# Patient Record
Sex: Male | Born: 2000 | Race: White | Hispanic: No | Marital: Single | Smoking: Never smoker
Health system: Southern US, Community
[De-identification: ages and names within clinical notes are randomized; demographics above are authoritative.]

## PROBLEM LIST (undated history)

## (undated) DIAGNOSIS — F4010 Social phobia, unspecified: Secondary | ICD-10-CM

## (undated) DIAGNOSIS — R45851 Suicidal ideations: Secondary | ICD-10-CM

## (undated) DIAGNOSIS — F333 Major depressive disorder, recurrent, severe with psychotic symptoms: Secondary | ICD-10-CM

## (undated) DIAGNOSIS — J302 Other seasonal allergic rhinitis: Secondary | ICD-10-CM

## (undated) DIAGNOSIS — F332 Major depressive disorder, recurrent severe without psychotic features: Secondary | ICD-10-CM

## (undated) DIAGNOSIS — Z789 Other specified health status: Secondary | ICD-10-CM

## (undated) DIAGNOSIS — K219 Gastro-esophageal reflux disease without esophagitis: Secondary | ICD-10-CM

## (undated) DIAGNOSIS — J45909 Unspecified asthma, uncomplicated: Secondary | ICD-10-CM

## (undated) HISTORY — PX: KNEE SURGERY: SHX244

---
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
## 2021-03-29 ENCOUNTER — Emergency Department
Admission: EM | Admit: 2021-03-29 | Discharge: 2021-03-29 | Disposition: A | Discharge status: Home / Self Care | Payer: No Typology Code available for payment source | Attending: Emergency Medicine | Admitting: Emergency Medicine

## 2021-03-29 ENCOUNTER — Other Ambulatory Visit: Payer: Self-pay

## 2021-03-29 ENCOUNTER — Encounter: Payer: Self-pay | Admitting: Intensive Care

## 2021-03-29 DIAGNOSIS — Y990 Civilian activity done for income or pay: Secondary | ICD-10-CM | POA: Diagnosis not present

## 2021-03-29 DIAGNOSIS — L089 Local infection of the skin and subcutaneous tissue, unspecified: Secondary | ICD-10-CM

## 2021-03-29 DIAGNOSIS — W268XXA Contact with other sharp object(s), not elsewhere classified, initial encounter: Secondary | ICD-10-CM | POA: Diagnosis not present

## 2021-03-29 DIAGNOSIS — S61217A Laceration without foreign body of left little finger without damage to nail, initial encounter: Secondary | ICD-10-CM | POA: Diagnosis not present

## 2021-03-29 DIAGNOSIS — Z79899 Other long term (current) drug therapy: Secondary | ICD-10-CM | POA: Diagnosis not present

## 2021-03-29 DIAGNOSIS — J45909 Unspecified asthma, uncomplicated: Secondary | ICD-10-CM | POA: Insufficient documentation

## 2021-03-29 DIAGNOSIS — S6992XA Unspecified injury of left wrist, hand and finger(s), initial encounter: Secondary | ICD-10-CM | POA: Diagnosis present

## 2021-03-29 MED ORDER — SULFAMETHOXAZOLE-TRIMETHOPRIM 800-160 MG PO TABS
1.0000 | ORAL_TABLET | Freq: Once | ORAL | Status: AC
  Administered 2021-03-29: 1 via ORAL
  Filled 2021-03-29: qty 1

## 2021-03-29 MED ORDER — SULFAMETHOXAZOLE-TRIMETHOPRIM 800-160 MG PO TABS: 1.0000 | ORAL_TABLET | Freq: Two times a day (BID) | ORAL | 0 refills | Status: AC

## 2021-03-29 NOTE — ED Triage Notes (Signed)
Patient has lac present to right hand, pinky. Bleeding controlled. Cut by glass 2 nights ago

## 2021-03-29 NOTE — ED Notes (Signed)
Patient given discharge instructions, all questions answered. Patient in possession of all belongings, directed to the discharge area  

## 2021-03-29 NOTE — ED Provider Notes (Signed)
[facility] Emergency Department Provider Note  ____________________________________________  Time seen: Approximately 8:17 PM  I have reviewed the triage vital signs and the nursing notes.   HISTORY  Chief Complaint Extremity Laceration    HPI Scott Lynch is a 20 y.o. male who presents emergency department with a laceration to the pinky finger of the left hand.  Patient sustained a laceration workplace glass 2 days ago.  Patient states that every time he moves at the edges open up, he has started to have some increased pain, erythema and a yellowish discharge.  Patient denies any edema of the digit itself.  Full range of motion is preserved.  Up-to-date on tetanus shot.       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 Authorizing Provider  sulfamethoxazole-trimethoprim (BACTRIM DS) 800-160 MG tablet Take 1 tablet by mouth 2 (two) times daily. 03/29/21  Yes Anyeli Hockenbury, Delorise Royals, PA-C  hydrOXYzine (ATARAX/VISTARIL) 25 MG tablet Take 0.5-1 tablets (12.5-25 mg total) by mouth 3 (three) times daily as needed for anxiety.  05/07/20   Darcel Smalling, MD  loratadine (CLARITIN) 10 MG tablet Take 10 mg by mouth daily.    [provider]  omeprazole (PRILOSEC OTC) 20 MG tablet Take by mouth. 07/25/19 07/24/20  [provider]  venlafaxine XR (EFFEXOR-XR) 37.5 MG 24 hr capsule Take 1 capsule (37.5 mg total) by mouth daily with breakfast. 04/09/20   Darcel Smalling, MD    Allergies Patient has no known allergies.  History reviewed. No pertinent family history.  Social History Social History   Tobacco Use   Smoking status: Never   Smokeless tobacco: Never  Vaping Use   Vaping Use: Never used  Substance Use Topics   Alcohol use: Yes   Drug use: No     Review of Systems  Constitutional: No fever/chills Eyes: No visual changes. No discharge ENT: No upper respiratory complaints. Cardiovascular: no chest pain. Respiratory: no cough. No SOB. Gastrointestinal: No abdominal pain.  No nausea, no vomiting.  No diarrhea.  No constipation. Musculoskeletal: Laceration to the pinky finger of the left hand Skin: Negative for rash, abrasions, lacerations, ecchymosis. Neurological: Negative for headaches, focal weakness or numbness.  10 System ROS otherwise negative.  ____________________________________________   PHYSICAL EXAM:  VITAL SIGNS: ED Triage Vitals [03/29/21 1736]  Enc Vitals Group     BP (!) 150/98     Pulse Rate (!) 58     Resp 16     Temp 99 F (37.2 C)     Temp Source Oral     SpO2 100 %     Weight 220 lb (99.8 kg)  Height 6\' 3"  (1.905 m)     Head Circumference      Peak Flow      Pain Score 3     Pain Loc      Pain Edu?      Excl. in GC?      Constitutional: Alert and oriented. Well appearing and in no acute distress. Eyes: Conjunctivae are normal. PERRL. EOMI. Head: Atraumatic. ENT:      Ears:       Nose: No congestion/rhinnorhea.      Mouth/Throat: Mucous membranes are moist.  Neck: No stridor.    Cardiovascular: Normal rate, regular rhythm. Normal S1  and S2.  Good peripheral circulation. Respiratory: Normal respiratory effort without tachypnea or retractions. Lungs CTAB. Good air entry to the bases with no decreased or absent breath sounds. Musculoskeletal: Full range of motion to all extremities. No gross deformities appreciated.  Laceration is visualized to the palmar aspect of the fifth digit left hand.  This is on the pinky finger.  Roughly a centimeter in length.  Edges do appear to have cellular death consistent with a 38-day-old laceration.  There is some dried crusting discharge and mild surrounding erythema concerning for early onset infection.  There is no sausage digit.  Range of motion is fully preserved. Neurologic:  Normal speech and language. No gross focal neurologic deficits are appreciated.  Skin:  Skin is warm, dry and intact. No rash noted. Psychiatric: Mood and affect are normal. Speech and behavior are normal. Patient exhibits appropriate insight and judgement.   ____________________________________________   LABS (all labs ordered are listed, but only abnormal results are displayed)  Labs Reviewed - No data to display ____________________________________________  EKG   ____________________________________________  RADIOLOGY   No results found.  ____________________________________________    PROCEDURES  Procedure(s) performed:    Procedures    Medications  sulfamethoxazole-trimethoprim (BACTRIM DS) 800-160 MG per tablet 1 tablet (has no administration in time range)     ____________________________________________   INITIAL IMPRESSION / ASSESSMENT AND PLAN / ED COURSE  Pertinent labs & imaging results that were available during my care of the patient were reviewed by me and considered in my medical decision making (see chart for details).  Review of the [place] CSRS was performed in accordance of the [facility] prior to dispensing any controlled drugs.           Patient's diagnosis is  consistent with infected laceration.  Patient presented to the emergency department after sustaining a laceration 2 days ago.  Patient did not feel like it needed primary closure at the time though he has had some increased pain, redness and discharge.  Findings were consistent with early onset of cellulitis surrounding what does appear to be several day old laceration.  Patient is given first dose antibiotics here.  Area is cleansed, and dressed.  I will place the patient on antibiotics at home.  Strict return precautions for any worsening pain, erythema, edema, drainage..  No indication at this time for labs or imaging.  Given the presentation no concern for infectious tenosynovitis.  Follow-up primary care as needed.  Patient is given ED precautions to return to the ED for any worsening or new symptoms.     ____________________________________________  FINAL CLINICAL IMPRESSION(S) / ED DIAGNOSES  Final diagnoses:  Laceration of finger with infection, initial encounter      NEW MEDICATIONS STARTED DURING THIS VISIT:  ED Discharge Orders          Ordered  sulfamethoxazole-trimethoprim (BACTRIM DS) 800-160 MG tablet  2 times daily        03/29/21 2020                This chart was dictated using voice recognition software/Dragon. Despite best efforts to proofread, errors can occur which can change the meaning. Any change was purely unintentional.    Racheal Patches, PA-C 03/29/21 2020    Delton Prairie, MD 03/29/21 2149

## 2021-09-22 ENCOUNTER — Other Ambulatory Visit: Payer: Self-pay

## 2021-09-22 ENCOUNTER — Encounter: Payer: Self-pay | Admitting: Emergency Medicine

## 2021-09-22 DIAGNOSIS — J45909 Unspecified asthma, uncomplicated: Secondary | ICD-10-CM | POA: Diagnosis not present

## 2021-09-22 DIAGNOSIS — R0602 Shortness of breath: Secondary | ICD-10-CM | POA: Diagnosis not present

## 2021-09-22 DIAGNOSIS — I2510 Atherosclerotic heart disease of native coronary artery without angina pectoris: Secondary | ICD-10-CM | POA: Diagnosis not present

## 2021-09-22 DIAGNOSIS — F410 Panic disorder [episodic paroxysmal anxiety] without agoraphobia: Secondary | ICD-10-CM | POA: Diagnosis present

## 2021-09-22 DIAGNOSIS — R079 Chest pain, unspecified: Secondary | ICD-10-CM | POA: Insufficient documentation

## 2021-09-22 LAB — CBC
HCT: 43.2 % (ref 39.0–52.0)
Hemoglobin: 15.2 g/dL (ref 13.0–17.0)
MCH: 28.5 pg (ref 26.0–34.0)
MCHC: 35.2 g/dL (ref 30.0–36.0)
MCV: 81.1 fL (ref 80.0–100.0)
Platelets: 299 10*3/uL (ref 150–400)
RBC: 5.33 MIL/uL (ref 4.22–5.81)
RDW: 12.4 % (ref 11.5–15.5)
WBC: 9.5 10*3/uL (ref 4.0–10.5)
nRBC: 0 % (ref 0.0–0.2)

## 2021-09-22 NOTE — ED Triage Notes (Signed)
Patient ambulatory to triage with steady gait, without difficulty or distress noted; pt reports mid CP radiating into left arm that began PTA accomp by nausea ?

## 2021-09-23 ENCOUNTER — Emergency Department
Admission: EM | Admit: 2021-09-23 | Discharge: 2021-09-23 | Disposition: A | Discharge status: Home / Self Care | Payer: No Typology Code available for payment source | Attending: Emergency Medicine | Admitting: Emergency Medicine

## 2021-09-23 ENCOUNTER — Emergency Department: Payer: No Typology Code available for payment source

## 2021-09-23 DIAGNOSIS — F410 Panic disorder [episodic paroxysmal anxiety] without agoraphobia: Secondary | ICD-10-CM

## 2021-09-23 LAB — BASIC METABOLIC PANEL
Anion gap: 8 (ref 5–15)
BUN: 12 mg/dL (ref 6–20)
CO2: 24 mmol/L (ref 22–32)
Calcium: 9.4 mg/dL (ref 8.9–10.3)
Chloride: 105 mmol/L (ref 98–111)
Creatinine, Ser: 1.05 mg/dL (ref 0.61–1.24)
GFR, Estimated: >60 mL/min (ref >60)
Glucose, Bld: 156 mg/dL — ABNORMAL HIGH (ref 70–99)
Potassium: 3.6 mmol/L (ref 3.5–5.1)
Sodium: 137 mmol/L (ref 135–145)

## 2021-09-23 LAB — TROPONIN I (HIGH SENSITIVITY)
Troponin I (High Sensitivity): 3 ng/L (ref <18)
Troponin I (High Sensitivity): 4 ng/L (ref <18)

## 2021-09-23 LAB — D-DIMER, QUANTITATIVE: D-Dimer, Quant: 0.31 ug/mL-FEU (ref 0.00–0.50)

## 2021-09-23 MED ORDER — LORAZEPAM 1 MG PO TABS: 1.0000 mg | ORAL_TABLET | Freq: Three times a day (TID) | ORAL | 0 refills | Status: AC | PRN

## 2021-09-23 MED ORDER — LORAZEPAM 1 MG PO TABS
1.0000 mg | ORAL_TABLET | Freq: Once | ORAL | Status: AC
Start: 2021-09-23 — End: 2021-09-23
  Administered 2021-09-23: 1 mg via ORAL
  Filled 2021-09-23: qty 1

## 2021-09-23 NOTE — ED Provider Notes (Signed)
? ?[facility] ?Provider Note ? ? ? Event Date/Time  ? First MD Initiated Contact with Patient 09/23/21 0153   ?  (approximate) ? ? ?History  ? ?Chest Pain ? ? ?HPI ? ?Scott Lynch is a 21 y.o. male with history of depression, anxiety who presents to the emergency department family for concerns for a panic attack today.  States that he got very anxious and started having left-sided chest pressure that radiated down both arms with shortness of breath.  He states he felt lightheaded.  No history of CAD, PE or DVT.  No lower extremity swelling or pain.  No fever or cough.  Denies SI or HI.  Has been on Atarax previously for anxiety. ? ? ?History provided by patient and family. ? ? ? ?Past Medical History:  ?Diagnosis Date  ? Acute stress disorder 01/25/2020  ? Asthma   ? GERD (gastroesophageal reflux disease)   ? Major depressive disorder, recurrent episode (HCC) 01/27/2017  ? MDD (major depressive disorder), recurrent severe, without psychosis (HCC) 01/27/2017  ? 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 Medical history non-contributory   ? Seasonal allergies   ? Social anxiety disorder 01/27/2017  ? Suicidal ideation 01/27/2017  ? ? ?Past Surgical History:  ?Procedure Laterality Date  ? KNEE SURGERY Left   ? ? ?MEDICATIONS:  ?Prior to Admission medications   ?Medication Sig Start Date End Date Taking? Authorizing Provider  ?hydrOXYzine (ATARAX/VISTARIL) 25 MG tablet Take 0.5-1 tablets (12.5-25 mg total) by mouth 3 (three) times daily as needed for anxiety. 05/07/20   Darcel Smalling, MD  ?loratadine (CLARITIN) 10 MG tablet Take 10 mg by mouth daily.    [provider]  ?omeprazole (PRILOSEC OTC) 20 MG tablet Take by mouth. 07/25/19 07/24/20  [provider]  ?sulfamethoxazole-trimethoprim (BACTRIM DS) 800-160 MG tablet Take 1 tablet by mouth 2 (two) times daily. 03/29/21   Cuthriell, Delorise Royals, PA-C  ?venlafaxine XR (EFFEXOR-XR)  37.5 MG 24 hr capsule Take 1 capsule (37.5 mg total) by mouth daily with breakfast. 04/09/20   Darcel Smalling, MD  ? ? ?Physical Exam  ? ?Triage Vital Signs: ?ED Triage Vitals  ?Enc Vitals Group  ?   BP 09/22/21 2345 130/79  ?   Pulse Rate 09/22/21 2345 100  ?   Resp 09/22/21 2345 18  ?   Temp 09/22/21 2345 98.3 ?F (36.8 ?C)  ?   Temp Source 09/22/21 2345 Oral  ?   SpO2 09/22/21 2345 100 %  ?   Weight 09/22/21 2339 200 lb (90.7 kg)  ?   Height 09/22/21 2339 6\' 3"  (1.905 m)  ?   Head Circumference --   ?   Peak Flow --   ?   Pain Score 09/22/21 2339 7  ?   Pain Loc --   ?   Pain Edu? --   ?   Excl. in GC? --   ? ? ?Most recent vital signs: ?Vitals:  ? 09/22/21 2345 09/23/21 0250  ?BP: 130/79 128/82  ?Pulse: 100 69  ?Resp: 18 14  ?Temp: 98.3 ?F (36.8 ?C)   ?SpO2: 100% 100%  ? ? ?CONSTITUTIONAL: Alert and oriented and responds appropriately to questions. Well-appearing; well-nourished ?HEAD: Normocephalic, atraumatic ?EYES: Conjunctivae clear, pupils appear equal, sclera nonicteric ?ENT: normal nose; moist mucous membranes ?NECK: Supple, normal ROM ?CARD: Regular and tachycardic; S1 and S2 appreciated; no murmurs, no clicks, no rubs, no gallops ?RESP: Normal chest excursion without  splinting or tachypnea; breath sounds clear and equal bilaterally; no wheezes, no rhonchi, no rales, no hypoxia or respiratory distress, speaking full sentences ?ABD/GI: Normal bowel sounds; non-distended; soft, non-tender, no rebound, no guarding, no peritoneal signs ?BACK: The back appears normal ?EXT: Normal ROM in all joints; no deformity noted, no edema; no cyanosis, no calf tenderness or calf swelling ?SKIN: Normal color for age and race; warm; no rash on exposed skin ?NEURO: Moves all extremities equally, normal speech ?PSYCH: The patient's mood and manner are appropriate.  Appears anxious but not in distress.  No SI or HI.  Not responding to internal stimuli. ? ? ?ED Results / Procedures / Treatments  ? ?LABS: ?(all labs ordered are  listed, but only abnormal results are displayed) ?Labs Reviewed  ?BASIC METABOLIC PANEL - Abnormal; Notable for the following components:  ?    Result Value  ? Glucose, Bld 156 (*)   ? All other components within normal limits  ?CBC  ?D-DIMER, QUANTITATIVE (NOT AT [facility])  ?TROPONIN I (HIGH SENSITIVITY)  ?TROPONIN I (HIGH SENSITIVITY)  ? ? ? ?EKG: ? EKG Interpretation ? ?Date/Time:  Monday September 22 2021 23:40:53 EDT ?Ventricular Rate:  121 ?PR Interval:  168 ?QRS Duration: 82 ?QT Interval:  294 ?QTC Calculation: 417 ?R Axis:   75 ?Text Interpretation: Sinus tachycardia Otherwise normal ECG No previous ECGs available Confirmed by Lithzy Bernard, Cyril Mourning [id number]) on 09/23/2021 2:00:50 AM ?  ? ?  ? ? ? ?RADIOLOGY: ?My personal review and interpretation of imaging: Chest x-ray clear. ? ?I have personally reviewed all radiology reports.   ?DG Chest 2 View ? ?Result Date: 09/23/2021 ?CLINICAL DATA:  Chest pain EXAM: CHEST - 2 VIEW COMPARISON:  None. FINDINGS: The heart size and mediastinal contours are within normal limits. Both lungs are clear. The visualized skeletal structures are unremarkable. IMPRESSION: No active cardiopulmonary disease. Electronically Signed   By: Donavan Foil M.D.   On: 09/23/2021 00:28   ? ? ?PROCEDURES: ? ?Critical Care performed: No ? ? ? ? ? ?.1-3 Lead EKG Interpretation ?Performed by: Jennilee Demarco, Delice Bison, DO ?Authorized by: Tsugio Elison, Delice Bison, DO  ? ?  Interpretation: abnormal   ?  ECG rate:  105 ?  ECG rate assessment: tachycardic   ?  Rhythm: sinus tachycardia   ?  Ectopy: none   ?  Conduction: normal   ? ? ? ?IMPRESSION / MDM / ASSESSMENT AND PLAN / ED COURSE  ?I reviewed the triage vital signs and the nursing notes. ? ? ? ?Patient here with chest pain and shortness of breath.  He is tachycardic.  History of panic attacks. ? ?The patient is on the cardiac monitor to evaluate for evidence of arrhythmia and/or significant heart rate changes. ? ? ?DIFFERENTIAL DIAGNOSIS (includes but not limited to):   Anxiety,  panic attack, PE, ACS, dissection, pneumonia, CHF, pneumothorax, musculoskeletal pain ? ? ?PLAN: We will obtain CBC, BMP, troponin x2, D-dimer, chest x-ray.  EKG shows sinus tachycardia without ischemia.  I suspect that there is a significant role that anxiety is playing today.  Will give oral Ativan.  He denies SI or HI.  I do not feel he needs emergent psychiatric evaluation at this time. ? ? ?MEDICATIONS GIVEN IN ED: ?Medications  ?LORazepam (ATIVAN) tablet 1 mg (1 mg Oral Given 09/23/21 0222)  ? ? ? ?ED COURSE: Patient's labs are reassuring.  No leukocytosis.  Normal hemoglobin.  Normal electrolytes.  Troponin x2 negative.  D-dimer negative.  Chest x-ray reviewed by  myself and radiologist and shows no infiltrate, edema, pneumothorax, widened mediastinum or cardiomegaly.  Patient reports feeling much better after Ativan and his heart rate has improved.  I feel he is safe to be discharged home.  Will discharge with brief course of Ativan and given outpatient resources.  Discussed return precautions. ? ?At this time, I do not feel there is any life-threatening condition present. I reviewed all nursing notes, vitals, pertinent previous records.  All lab and urine results, EKGs, imaging ordered have been independently reviewed and interpreted by myself.  I reviewed all available radiology reports from any imaging ordered this visit.  Based on my assessment, I feel the patient is safe to be discharged home without further emergent workup and can continue workup as an outpatient as needed. Discussed all findings, treatment plan as well as usual and customary return precautions with patient and mother.  They verbalize understanding and are comfortable with this plan.  Outpatient follow-up has been provided as needed.  All questions have been answered. ? ? ? ?CONSULTS: No emergent psychiatric evaluation needed at this time given patient is feeling better and he denies SI or HI. ? ? ?OUTSIDE RECORDS REVIEWED: Reviewed  patient's last family medicine note with Bubba Camp on 08/26/2021. ? ? ? ? ? ? ? ? ?FINAL CLINICAL IMPRESSION(S) / ED DIAGNOSES  ? ?Final diagnoses:  ?Panic attack  ? ? ? ?Rx / DC Orders  ? ?ED Disc

## 2022-12-09 IMAGING — CR DG CHEST 2V
1 series · 2 of 2 positions shown · non-contrast
Comparison: None.

CLINICAL DATA: Chest pain

EXAM:
CHEST - 2 VIEW

[Series 1: dg chest 2 view · 0.14mm/px · 2 of 2 slices shown]
[im 1/2]
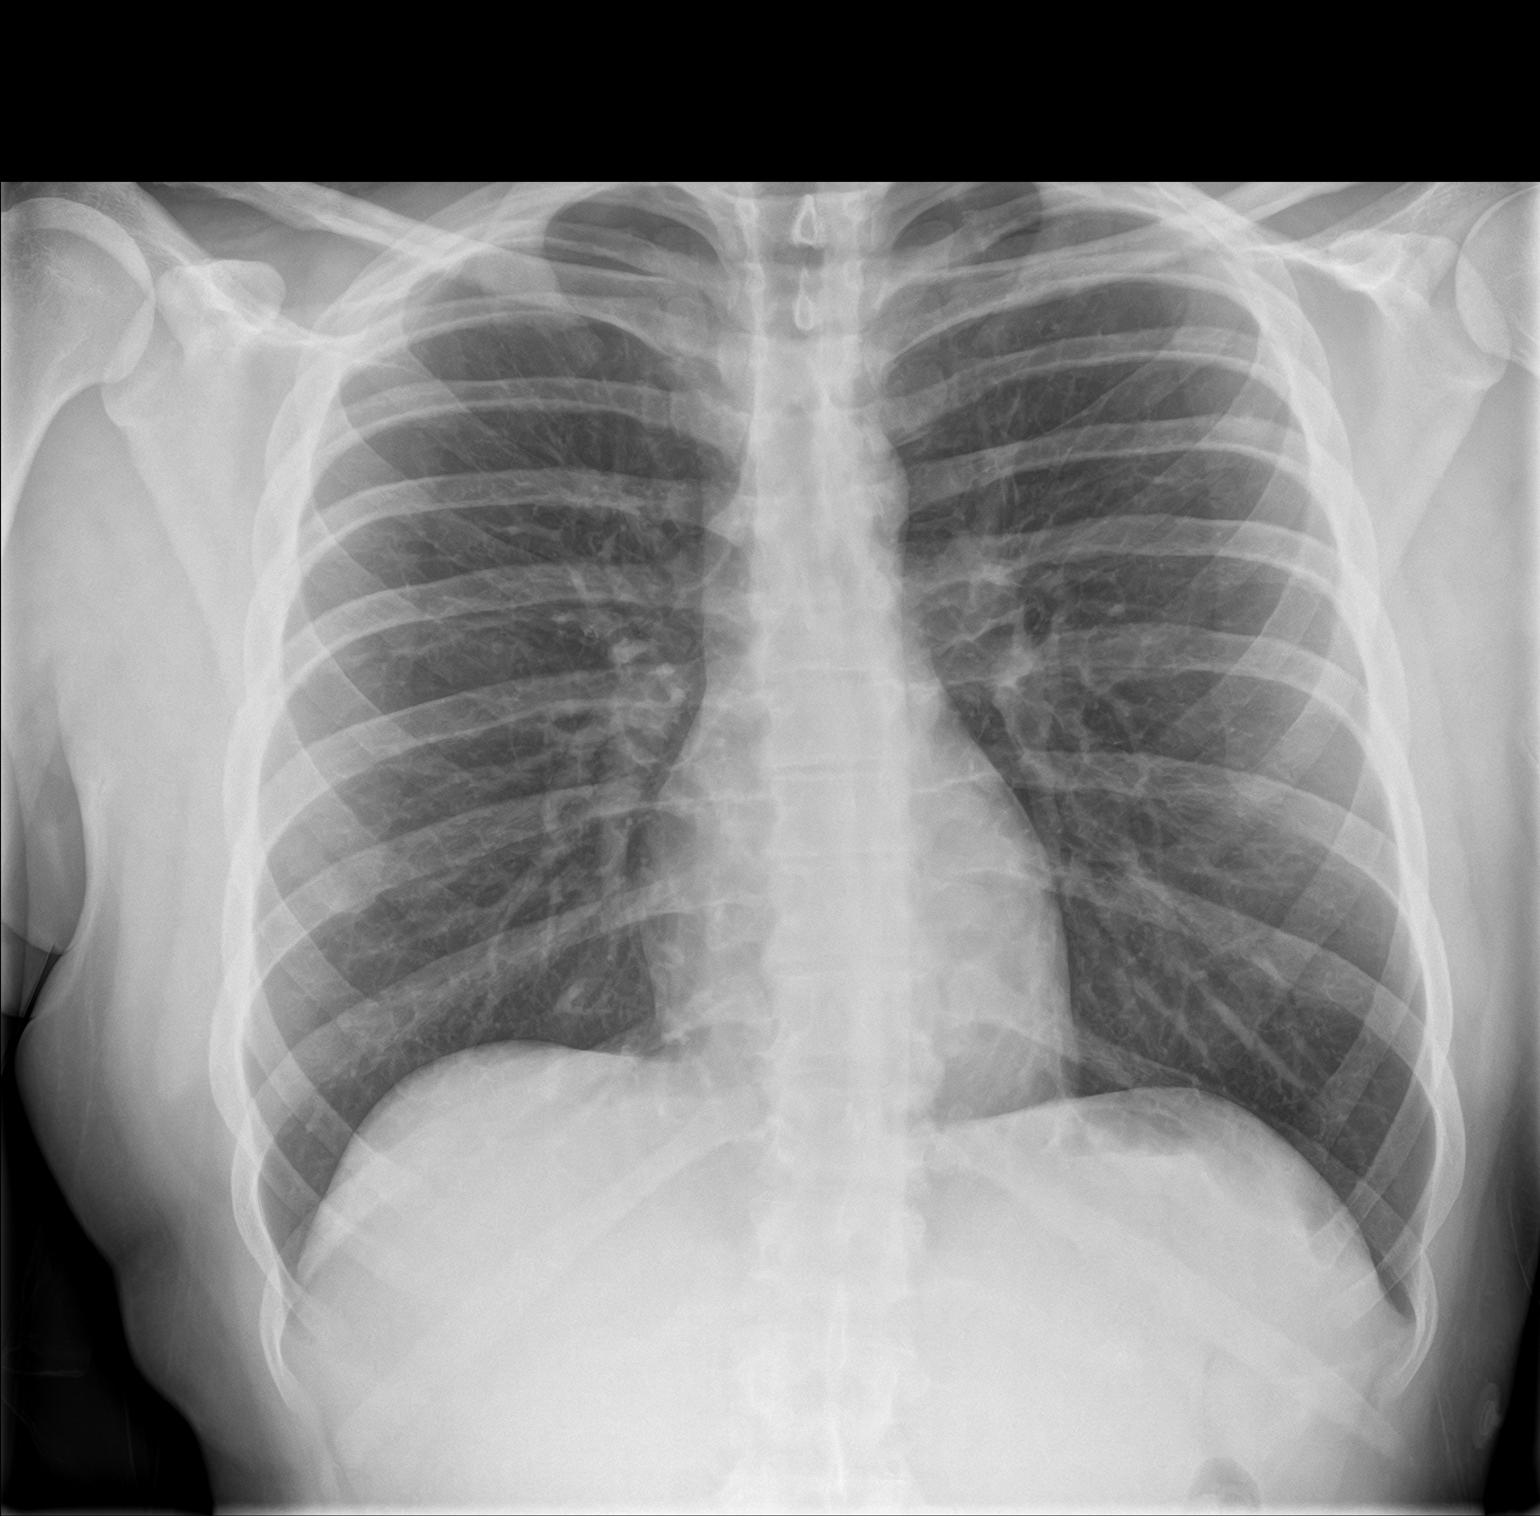
[im 2/2]
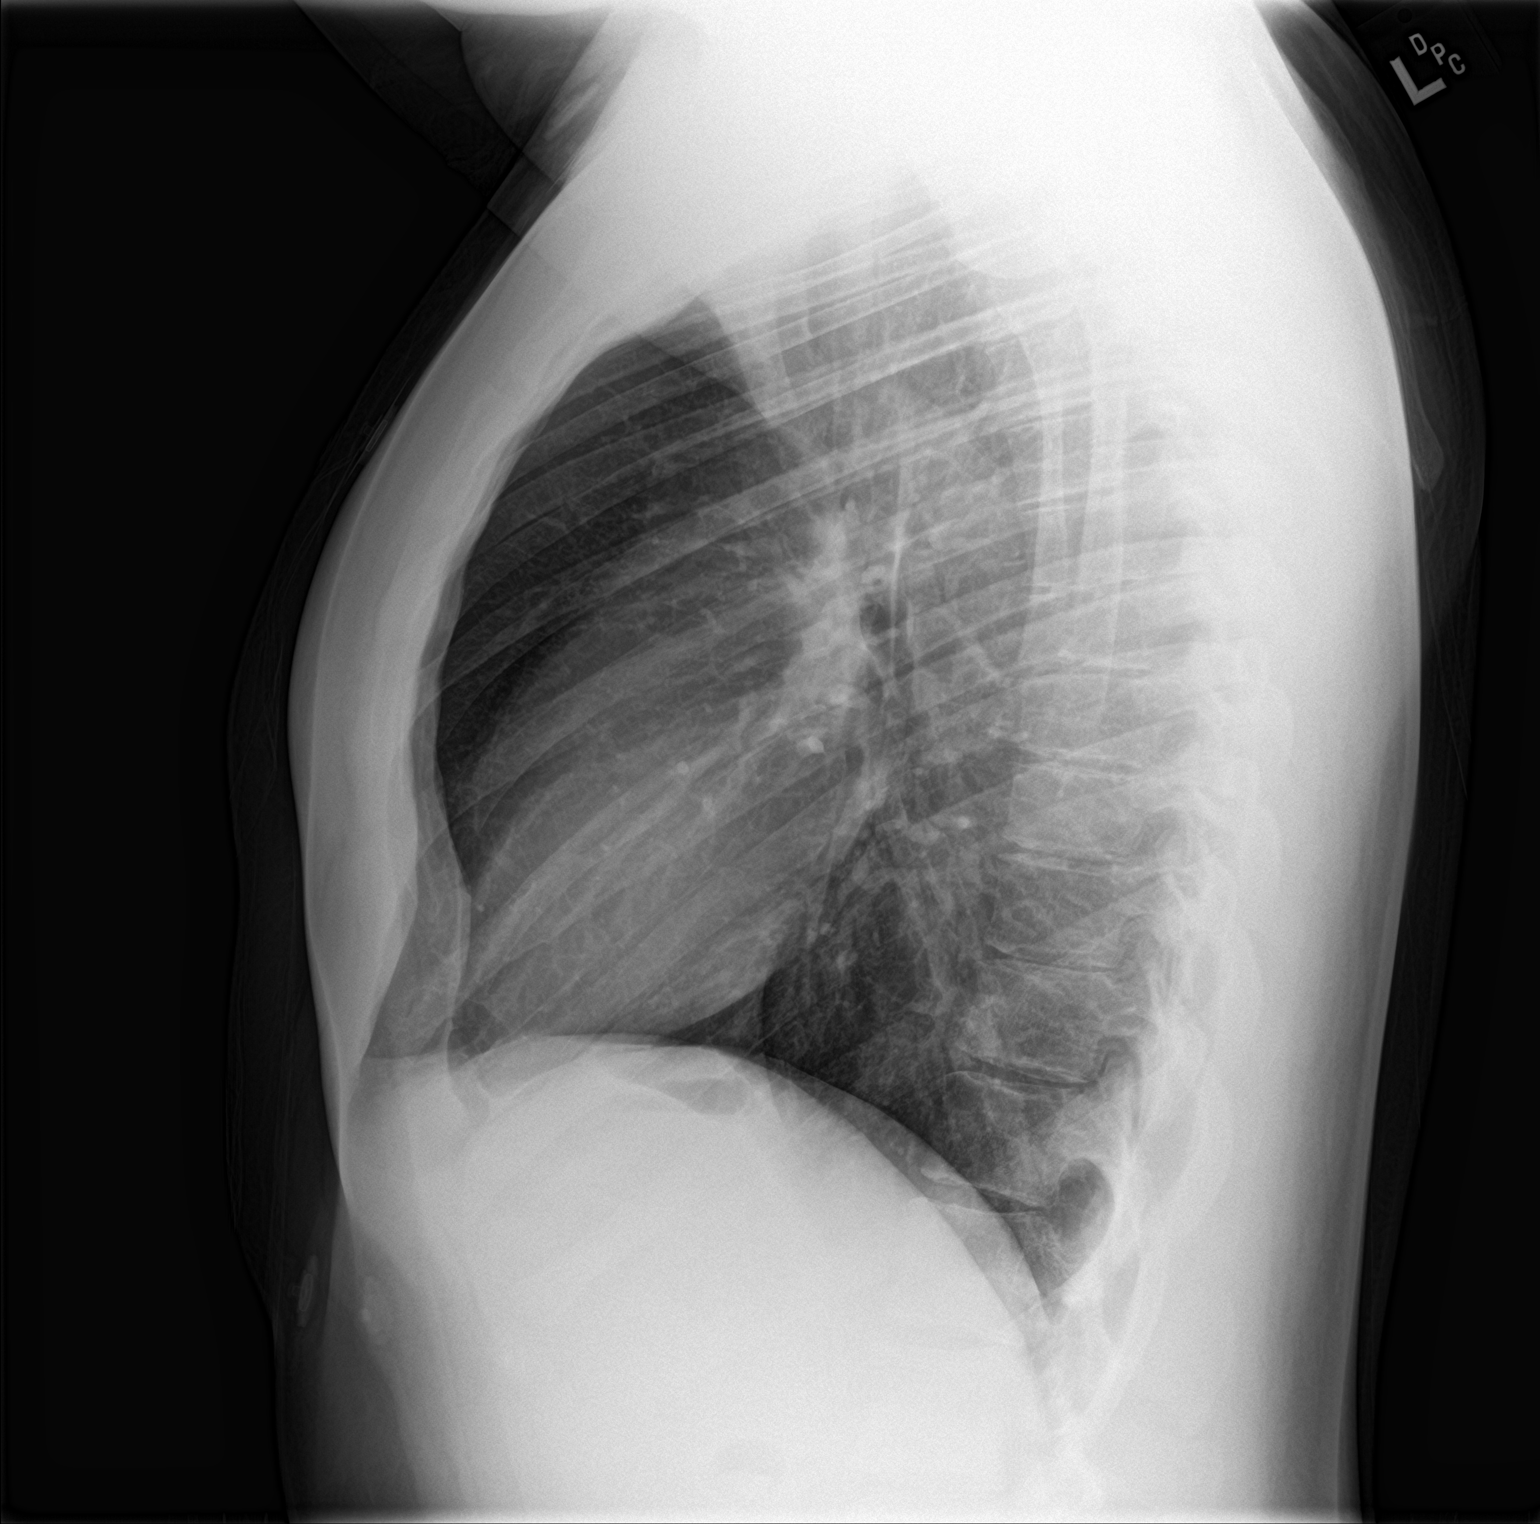

[2 of 2 positions shown; findings below may reference images not displayed]

FINDINGS: The heart size and mediastinal contours are within normal limits.
Both lungs are clear. The visualized skeletal structures are
unremarkable.
IMPRESSION: No active cardiopulmonary disease.

## 2023-11-15 ENCOUNTER — Other Ambulatory Visit: Payer: Self-pay

## 2023-11-15 DIAGNOSIS — Y990 Civilian activity done for income or pay: Secondary | ICD-10-CM | POA: Diagnosis not present

## 2023-11-15 DIAGNOSIS — S61412A Laceration without foreign body of left hand, initial encounter: Secondary | ICD-10-CM | POA: Diagnosis not present

## 2023-11-15 DIAGNOSIS — J45909 Unspecified asthma, uncomplicated: Secondary | ICD-10-CM | POA: Insufficient documentation

## 2023-11-15 DIAGNOSIS — Z23 Encounter for immunization: Secondary | ICD-10-CM | POA: Diagnosis not present

## 2023-11-15 DIAGNOSIS — W268XXA Contact with other sharp object(s), not elsewhere classified, initial encounter: Secondary | ICD-10-CM | POA: Insufficient documentation

## 2023-11-15 DIAGNOSIS — S6992XA Unspecified injury of left wrist, hand and finger(s), initial encounter: Secondary | ICD-10-CM | POA: Diagnosis present

## 2023-11-15 NOTE — ED Triage Notes (Signed)
 Pt presents via POV c/o laceration to left hand at work.   Bleeding currently controlled. Worker's comp injury.

## 2023-11-16 ENCOUNTER — Emergency Department
Admission: EM | Admit: 2023-11-16 | Discharge: 2023-11-16 | Disposition: A | Discharge status: Home / Self Care | Attending: Emergency Medicine | Admitting: Emergency Medicine

## 2023-11-16 DIAGNOSIS — S61412A Laceration without foreign body of left hand, initial encounter: Secondary | ICD-10-CM

## 2023-11-16 MED ORDER — TETANUS-DIPHTH-ACELL PERTUSSIS 5-2.5-18.5 LF-MCG/0.5 IM SUSY
0.5000 mL | PREFILLED_SYRINGE | Freq: Once | INTRAMUSCULAR | Status: AC
  Administered 2023-11-16: 0.5 mL via INTRAMUSCULAR

## 2023-11-16 MED ORDER — LIDOCAINE-EPINEPHRINE (PF) 2 %-1:200000 IJ SOLN
20.0000 mL | Freq: Once | INTRAMUSCULAR | Status: AC
  Administered 2023-11-16: 20 mL via INTRADERMAL
  Filled 2023-11-16: qty 20

## 2023-11-16 MED ORDER — BACITRACIN ZINC 500 UNIT/GM EX OINT
TOPICAL_OINTMENT | Freq: Once | CUTANEOUS | Status: AC
  Administered 2023-11-16: 1 via TOPICAL
  Filled 2023-11-16: qty 0.9

## 2023-11-16 NOTE — Discharge Instructions (Addendum)
 You may alternate Tylenol  1000 mg every 6 hours as needed for pain, fever and Ibuprofen 800 mg every 6-8 hours as needed for pain, fever.  Please take Ibuprofen with food.  Do not take more than 4000 mg of Tylenol  (acetaminophen ) in a 24 hour period.  You may return to work on 11/16/2023 without restrictions.

## 2023-11-16 NOTE — ED Provider Notes (Signed)
 [facility] Provider Note    Event Date/Time   First MD Initiated Contact with Patient 11/16/23 [phone]     (approximate)   History   Laceration   HPI  Scott Lynch is a 23 y.o. male right-hand-dominant with history of depression, asthma, allergies who presents to the emergency department with complaints of laceration to left hand.  States he cut it on a power box at work.  He is unsure of his last tetanus vaccine.  He has normal movement of his fingers.  No numbness.  No other injury.   History provided by patient.    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 Authorizing Provider  loratadine (CLARITIN) 10 MG tablet Take 10 mg by mouth daily.    [provider]  omeprazole (PRILOSEC OTC) 20 MG tablet Take by mouth. 07/25/19 07/24/20  [provider]  sulfamethoxazole -trimethoprim  (BACTRIM  DS) 800-160 MG tablet Take 1 tablet by mouth 2 (two) times daily. 03/29/21   Cuthriell, Ardath Bears, PA-C  venlafaxine  XR (EFFEXOR -XR) 37.5 MG 24 hr capsule Take 1 capsule (37.5 mg total) by mouth daily with breakfast. 04/09/20   Pilar Bridge, MD    Physical Exam   Triage Vital Signs: ED Triage Vitals  Encounter Vitals Group     BP 11/15/23 2300 (!) 150/84     Girls Systolic BP Percentile --      Girls Diastolic BP Percentile --       Boys Systolic BP Percentile --      Boys Diastolic BP Percentile --      Pulse Rate 11/15/23 2300 65     Resp 11/15/23 2300 18     Temp 11/15/23 2300 99 F (37.2 C)     Temp Source 11/15/23 2300 Oral     SpO2 11/15/23 2300 96 %     Weight 11/15/23 2301 248 lb (112.5 kg)     Height 11/15/23 2301 6' 3 (1.905 m)     Head Circumference --      Peak Flow --      Pain Score 11/15/23 2305 3     Pain Loc --      Pain Education --      Exclude from Growth Chart --     Most recent vital signs: Vitals:   11/15/23 2300 11/16/23 0459  BP: (!) 150/84 133/87  Pulse: 65 (!) 56  Resp: 18 18  Temp: 99 F (37.2 C) 98.7 F (37.1 C)  SpO2: 96% 99%     CONSTITUTIONAL: Alert and responds appropriately to questions. Well-appearing; well-nourished HEAD: Normocephalic, atraumatic EYES: Conjunctivae clear, pupils appear equal ENT: normal nose; moist mucous membranes NECK: Normal range of motion CARD: Regular rate and rhythm RESP: Normal chest excursion without splinting or tachypnea; no hypoxia or respiratory distress, speaking full sentences ABD/GI: non-distended EXT: Normal ROM in all joints, no major deformities noted,  4 cm laceration to the dorsal left hand just between the thumb and index finger with no sign of foreign body or tendon involvement, normal capillary refill and normal range of motion in all fingers of the left hand.  2+ left radial pulse. SKIN: Normal color for age and race, no rashes on exposed skin NEURO: Moves all extremities equally, normal speech, no facial asymmetry noted PSYCH: The patient's mood and manner are appropriate. Grooming and personal hygiene are appropriate.  ED Results / Procedures / Treatments   LABS: (all labs ordered are listed, but only abnormal results are displayed) Labs Reviewed - No data to display   EKG:   RADIOLOGY: My personal review and interpretation of imaging:    I have personally reviewed all radiology reports. No results  found.   PROCEDURES:  Critical Care performed: No LACERATION REPAIR Performed by: Verneda Golder Authorized by: Verneda Golder Consent: Verbal consent obtained. Risks and benefits: risks, benefits and alternatives were discussed Consent given by: patient Patient identity confirmed: provided demographic data Prepped and Draped in normal sterile fashion Wound explored  Laceration Location: Left hand  Laceration Length: 4cm  No Foreign Bodies seen or palpated  Anesthesia: local infiltration  Local anesthetic: lidocaine 2% with epinephrine  Anesthetic total: 5 ml  Irrigation method: syringe Amount of cleaning: standard  Skin closure: Superficial  Number of sutures: 8  Technique: Area anesthetized using lidocaine 2% with epinephrine. Wound irrigated copiously with sterile saline. Wound then cleaned with Betadine and draped in sterile fashion. Wound closed using 8 simple interrupted sutures with 5-0 Vicryl.  Bacitracin and sterile dressing applied. Good wound approximation and hemostasis achieved.    Patient tolerance: Patient tolerated the procedure well with no immediate complications.   Procedures    IMPRESSION / MDM / ASSESSMENT AND PLAN / ED COURSE  I reviewed the triage vital signs and the nursing notes.   Patient here with left hand laceration.     DIFFERENTIAL DIAGNOSIS (includes but not limited to):   Laceration, no sign of foreign body or tendon involvement, no sign of arterial injury, doubt fracture, no sign of superimposed  Patient's presentation is most consistent with acute, uncomplicated illness.  PLAN: Will clean and repair wound.  Will update tetanus vaccine.   MEDICATIONS GIVEN IN ED: Medications  Tdap (BOOSTRIX) injection 0.5 mL (0.5 mLs Intramuscular Given 11/16/23 0434)  lidocaine-EPINEPHrine (XYLOCAINE W/EPI) 2 %-1:200000 (PF) injection 20 mL (20 mLs Intradermal Given 11/16/23 0459)  bacitracin ointment (1 Application Topical Given 11/16/23  0459)     ED COURSE: Patient tolerated laceration repair without difficulty.  Discussed wound care instructions, return precautions.   At this time, I do not feel there is any life-threatening condition present. I reviewed all nursing notes, vitals, pertinent previous records.  All lab and urine results, EKGs, imaging ordered have been independently reviewed and interpreted by myself.  I reviewed all available radiology reports from any imaging ordered this visit.  Based on my assessment, I feel the patient is safe to be discharged home without further emergent workup and can continue workup as an outpatient as needed. Discussed all findings, treatment plan as well as usual and customary return precautions.  They verbalize understanding and are comfortable with this plan.  Outpatient follow-up has been provided as needed.  All questions have been answered.    CONSULTS:  none   OUTSIDE RECORDS REVIEWED: Reviewed neurology note in February 2024.     FINAL CLINICAL IMPRESSION(S) / ED DIAGNOSES   Final diagnoses:  Laceration of left hand without foreign body, initial encounter     Rx / DC Orders   ED Discharge Orders     None        Note:  This document was prepared using Dragon voice recognition software and may include unintentional dictation errors.   Manika Hast, Clover Dao, DO 11/16/23 [phone]

## 2023-11-16 NOTE — ED Notes (Signed)
 Pt states he cut his hand with a metal piece of machinery. While at work.  PT noted to have a open laceration to the top of his left hand. Bleeding controlled at this time.
